# Patient Record
Sex: Male | Born: 1990
Health system: Southern US, Community
[De-identification: ages and names within clinical notes are randomized; demographics above are authoritative.]

## PROBLEM LIST (undated history)

## (undated) DIAGNOSIS — F32A Depression, unspecified: Secondary | ICD-10-CM

## (undated) DIAGNOSIS — F419 Anxiety disorder, unspecified: Secondary | ICD-10-CM

## (undated) DIAGNOSIS — F329 Major depressive disorder, single episode, unspecified: Secondary | ICD-10-CM

## (undated) HISTORY — PX: APPENDECTOMY: SHX54

## (undated) HISTORY — DX: Depression, unspecified: F32.A

## (undated) HISTORY — DX: Major depressive disorder, single episode, unspecified: F32.9

---

## 2013-12-26 ENCOUNTER — Encounter (HOSPITAL_COMMUNITY): Payer: Self-pay | Admitting: Emergency Medicine

## 2013-12-26 ENCOUNTER — Emergency Department (HOSPITAL_COMMUNITY)
Admission: EM | Admit: 2013-12-26 | Discharge: 2013-12-27 | Disposition: A | Discharge status: Home / Self Care | Payer: Self-pay | Attending: Emergency Medicine | Admitting: Emergency Medicine

## 2013-12-26 DIAGNOSIS — M25562 Pain in left knee: Secondary | ICD-10-CM

## 2013-12-26 DIAGNOSIS — F17200 Nicotine dependence, unspecified, uncomplicated: Secondary | ICD-10-CM | POA: Insufficient documentation

## 2013-12-26 DIAGNOSIS — M25569 Pain in unspecified knee: Secondary | ICD-10-CM | POA: Insufficient documentation

## 2013-12-26 DIAGNOSIS — R209 Unspecified disturbances of skin sensation: Secondary | ICD-10-CM | POA: Insufficient documentation

## 2013-12-26 DIAGNOSIS — Z8659 Personal history of other mental and behavioral disorders: Secondary | ICD-10-CM | POA: Insufficient documentation

## 2013-12-26 DIAGNOSIS — R202 Paresthesia of skin: Secondary | ICD-10-CM

## 2013-12-26 HISTORY — DX: Anxiety disorder, unspecified: F41.9

## 2013-12-26 NOTE — ED Notes (Signed)
Chronic lt knee pain.  More pain for 8 months and now he has lt 3rd toe numbness on his lt foot for several days

## 2013-12-27 ENCOUNTER — Emergency Department (HOSPITAL_COMMUNITY): Payer: Self-pay

## 2013-12-27 MED ORDER — NAPROXEN 500 MG PO TABS: 500.0000 mg | ORAL_TABLET | Freq: Two times a day (BID) | ORAL | Status: DC

## 2013-12-27 NOTE — ED Notes (Signed)
Patient transported to X-ray 

## 2013-12-27 NOTE — ED Provider Notes (Signed)
CSN: [id number]     Arrival date & time 12/26/13  2203 History   First MD Initiated Contact with Patient 12/27/13 0030     Chief Complaint  Patient presents with  . Knee Pain     (Consider location/radiation/quality/duration/timing/severity/associated sxs/prior Treatment) Patient is a 23 y.o. male presenting with knee pain. The history is provided by the patient. No language interpreter was used.  Knee Pain Pain details:    Quality:  Throbbing and sharp   Radiates to:  Does not radiate   Severity:  Moderate   Duration:  2 days   Timing:  Intermittent   Progression:  Worsening Chronicity:  Recurrent Foreign body present:  No foreign bodies Prior injury to area:  Unable to specify Risk factors: no concern for non-accidental trauma and no known bone disorder     Past Medical History  Diagnosis Date  . Anxiety    History reviewed. No pertinent past surgical history. No family history on file. History  Substance Use Topics  . Smoking status: Current Every Day Smoker  . Smokeless tobacco: Not on file  . Alcohol Use: No    Review of Systems  All other systems reviewed and are negative.     Allergies  Review of patient's allergies indicates no known allergies.  Home Medications  No current outpatient prescriptions on file. BP 140/71  Pulse 75  Temp(Src) 98.3 F (36.8 C) (Oral)  Resp 18  Ht 6\' 1"  (1.854 m)  Wt 223 lb (101.152 kg)  BMI 29.43 kg/m2  SpO2 99% Physical Exam  Nursing note and vitals reviewed. Constitutional: He is oriented to person, place, and time. He appears well-developed and well-nourished.  HENT:  Head: Normocephalic.  Eyes: Pupils are equal, round, and reactive to light.  Neck: Normal range of motion.  Cardiovascular: Normal rate and regular rhythm.   Pulmonary/Chest: Effort normal and breath sounds normal.  Abdominal: Soft. Bowel sounds are normal.  Musculoskeletal: He exhibits tenderness. He exhibits no edema.  Neurological: He is alert  and oriented to person, place, and time.  Skin: Skin is warm and dry.  Psychiatric: He has a normal mood and affect. His behavior is normal. Judgment and thought content normal.   Mild tenderness to the medial and lateral aspect of left knee.  No swelling or deformity.  No laxity noted. Paresthesia of middle left toe, primarily at distal aspect.  Toe warm to touch, brisk capillary refill. ED Course  Procedures (including critical care time) Labs Review Labs Reviewed - No data to display Imaging Review No results found.   EKG Interpretation None     Radiology results reviewed and shared with patient.  No indication of acute injury. Patient with history of chronic knee problems, is awaiting initiation of his health insurance in June 2015 before seeking specialty care.    MDM   Final diagnoses:  None    Left knee pain. Paresthesia of middle left toe.    Jimmye Normanavid John Caven Perine, NP 12/27/13 [phone]

## 2013-12-27 NOTE — Discharge Instructions (Signed)
Paresthesia Paresthesia is an abnormal burning or prickling sensation. This sensation is generally felt in the hands, arms, legs, or feet. However, it may occur in any part of the body. It is usually not painful. The feeling may be described as:  Tingling or numbness.  "Pins and needles."  Skin crawling.  Buzzing.  Limbs "falling asleep."  Itching. Most people experience temporary (transient) paresthesia at some time in their lives. CAUSES  Paresthesia may occur when you breathe too quickly (hyperventilation). It can also occur without any apparent cause. Commonly, paresthesia occurs when pressure is placed on a nerve. The feeling quickly goes away once the pressure is removed. For some people, however, paresthesia is a long-lasting (chronic) condition caused by an underlying disorder. The underlying disorder may be:  A traumatic, direct injury to nerves. Examples include a:  Broken (fractured) neck.  Fractured skull.  A disorder affecting the brain and spinal cord (central nervous system). Examples include:  Transverse myelitis.  Encephalitis.  Transient ischemic attack.  Multiple sclerosis.  Stroke.  Tumor or blood vessel problems, such as an arteriovenous malformation pressing against the brain or spinal cord.  A condition that damages the peripheral nerves (peripheral neuropathy). Peripheral nerves are not part of the brain and spinal cord. These conditions include:  Diabetes.  Peripheral vascular disease.  Nerve entrapment syndromes, such as carpal tunnel syndrome.  Shingles.  Hypothyroidism.  Vitamin B12 deficiencies.  Alcoholism.  Heavy metal poisoning (lead, arsenic).  Rheumatoid arthritis.  Systemic lupus erythematosus. DIAGNOSIS  Your caregiver will attempt to find the underlying cause of your paresthesia. Your caregiver may:  Take your medical history.  Perform a physical exam.  Order various lab tests.  Order imaging tests. TREATMENT    Treatment for paresthesia depends on the underlying cause. HOME CARE INSTRUCTIONS  Avoid drinking alcohol.  You may consider massage or acupuncture to help relieve your symptoms.  Keep all follow-up appointments as directed by your caregiver. SEEK IMMEDIATE MEDICAL CARE IF:   You feel weak.  You have trouble walking or moving.  You have problems with speech or vision.  You feel confused.  You cannot control your bladder or bowel movements.  You feel numbness after an injury.  You faint.  Your burning or prickling feeling gets worse when walking.  You have pain, cramps, or dizziness.  You develop a rash. MAKE SURE YOU:  Understand these instructions.  Will watch your condition.  Will get help right away if you are not doing well or get worse. Document Released: 08/23/2002 Document Revised: 11/25/2011 Document Reviewed: 05/24/2011 [facility]ExitCare Patient Information 2014 [place], [place]. Knee Pain Knee pain can be a result of an injury or other medical conditions. Treatment will depend on the cause of your pain. HOME CARE  Only take medicine as told by your doctor.  Keep a healthy weight. Being overweight can make the knee hurt more.  Stretch before exercising or playing sports.  If there is constant knee pain, change the way you exercise. Ask your doctor for advice.  Make sure shoes fit well. Choose the right shoe for the sport or activity.  Protect your knees. Wear kneepads if needed.  Rest when you are tired. GET HELP RIGHT AWAY IF:   Your knee pain does not stop.  Your knee pain does not get better.  Your knee joint feels hot to the touch.  You have a fever. MAKE SURE YOU:   Understand these instructions.  Will watch this condition.  Will get help right  away if you are not doing well or get worse. Document Released: 11/29/2008 Document Revised: 11/25/2011 Document Reviewed: 11/29/2008 [facility] Patient Information 2014 [place]Care, [place].

## 2013-12-27 NOTE — ED Provider Notes (Signed)
Medical screening examination/treatment/procedure(s) were performed by non-physician practitioner and as supervising physician I was immediately available for consultation/collaboration.   EKG Interpretation None        Kecia Swoboda, MD 12/27/13 0626 

## 2014-03-16 ENCOUNTER — Ambulatory Visit (INDEPENDENT_AMBULATORY_CARE_PROVIDER_SITE_OTHER): Payer: BC Managed Care – PPO | Admitting: Emergency Medicine

## 2014-03-16 VITALS — BP 120/76 | HR 72 | Temp 98.0°F | Resp 16 | Ht 71.5 in | Wt 217.0 lb

## 2014-03-16 DIAGNOSIS — M5432 Sciatica, left side: Secondary | ICD-10-CM

## 2014-03-16 DIAGNOSIS — M5430 Sciatica, unspecified side: Secondary | ICD-10-CM

## 2014-03-16 DIAGNOSIS — H6980 Other specified disorders of Eustachian tube, unspecified ear: Secondary | ICD-10-CM

## 2014-03-16 DIAGNOSIS — F17200 Nicotine dependence, unspecified, uncomplicated: Secondary | ICD-10-CM

## 2014-03-16 DIAGNOSIS — H6982 Other specified disorders of Eustachian tube, left ear: Secondary | ICD-10-CM

## 2014-03-16 MED ORDER — VARENICLINE TARTRATE 0.5 MG PO TABS: ORAL_TABLET | ORAL | Status: DC

## 2014-03-16 MED ORDER — TRIAMCINOLONE ACETONIDE 55 MCG/ACT NA AERO: 2.0000 | INHALATION_SPRAY | Freq: Every day | NASAL | Status: DC

## 2014-03-16 MED ORDER — NAPROXEN SODIUM 550 MG PO TABS: 550.0000 mg | ORAL_TABLET | Freq: Two times a day (BID) | ORAL | Status: AC

## 2014-03-16 MED ORDER — VARENICLINE TARTRATE 1 MG PO TABS: 1.0000 mg | ORAL_TABLET | Freq: Two times a day (BID) | ORAL | Status: DC

## 2014-03-16 NOTE — Progress Notes (Signed)
[facility] [address], [place] [address] [phone]  Date:  03/16/2014   Name:  Edwin RosebushJeremy Novak   DOB:  1991/01/21   MRN:  [id number]  PCP:  No PCP Per Patient    Chief Complaint: Otalgia, Foot Pain and Nicotine Dependence   History of Present Illness:  Edwin RosebushJeremy Novak is a 23 y.o. very pleasant male patient who presents with the following:  Has one year history of pain and numbness in left middle (3rd) toe.  Says the pain radiates proximally into calf and back of thigh.  Says worse with prolonged standing.  Some weakness at times. No history of injury or over use. Pain diminished with rest. Has post nasal drainage and night time cough with pain in left ear.  No fever or chills.  No nasal drainage or sputum production. No wheezing or shortness of breath.  No nausea or vomiting.   Requesting chantix  Smokes 1 ppd  X 12 years. No improvement with over the counter medications or other home remedies. Denies other complaint or health concern today.   There are no active problems to display for this patient.   Past Medical History  Diagnosis Date  . Anxiety   . Depression     Past Surgical History  Procedure Laterality Date  . Appendectomy      History  Substance Use Topics  . Smoking status: Current Every Day Smoker  . Smokeless tobacco: Not on file  . Alcohol Use: No    Family History  Problem Relation Age of Onset  . Cancer Mother     ovarian  . Mental illness Mother   . Hyperlipidemia Father   . Hypertension Father     No Known Allergies  Medication list has been reviewed and updated.  Current Outpatient Prescriptions on File Prior to Visit  Medication Sig Dispense Refill  . naproxen (NAPROSYN) 500 MG tablet Take 1 tablet (500 mg total) by mouth 2 (two) times daily.  20 tablet  0   No current facility-administered medications on file prior to visit.    Review of Systems:  As per HPI, otherwise negative.    Physical  Examination: Filed Vitals:   03/16/14 1036  BP: 120/76  Pulse: 72  Temp: 98 F (36.7 C)  Resp: 16   Filed Vitals:   03/16/14 1036  Height: 5' 11.5" (1.816 m)  Weight: 217 lb (98.431 kg)   Body mass index is 29.85 kg/(m^2). Ideal Body Weight: Weight in (lb) to have BMI = 25: 181.4  GEN: WDWN, NAD, Non-toxic, A & O x 3 HEENT: Atraumatic, Normocephalic. Neck supple. No masses, No LAD. Ears and Nose: No external deformity.  Eustachian tube dysfunction on left CV: RRR, No M/G/R. No JVD. No thrill. No extra heart sounds. PULM: CTA B, no wheezes, crackles, rhonchi. No retractions. No resp. distress. No accessory muscle use. ABD: S, NT, ND, +BS. No rebound. No HSM. EXTR: No c/c/e NEURO Normal gait. Motor intact. PSYCH: Normally interactive. Conversant. Not depressed or anxious appearing.  Calm demeanor.    Assessment and Plan: Sciatic neuritis MRI Anaprox Eustachian tube dysfunction left nasacort Tobacco abuse chantix  Signed,  Phillips OdorJeffery Anderson, MD

## 2014-03-16 NOTE — Patient Instructions (Signed)
Smoking Cessation, Tips for Success If you are ready to quit smoking, congratulations! You have chosen to help yourself be healthier. Cigarettes bring nicotine, tar, carbon monoxide, and other irritants into your body. Your lungs, heart, and blood vessels will be able to work better without these poisons. There are many different ways to quit smoking. Nicotine gum, nicotine patches, a nicotine inhaler, or nicotine nasal spray can help with physical craving. Hypnosis, support groups, and medicines help break the habit of smoking. WHAT THINGS CAN I DO TO MAKE QUITTING EASIER?  Here are some tips to help you quit for good: Pick a date when you will quit smoking completely. Tell all of your friends and family about your plan to quit on that date. Do not try to slowly cut down on the number of cigarettes you are smoking. Pick a quit date and quit smoking completely starting on that day. Throw away all cigarettes.  Clean and remove all ashtrays from your home, work, and car.  On a card, write down your reasons for quitting. Carry the card with you and read it when you get the urge to smoke.  Cleanse your body of nicotine. Drink enough water and fluids to keep your urine clear or pale yellow. Do this after quitting to flush the nicotine from your body.  Learn to predict your moods. Do not let a bad situation be your excuse to have a cigarette. Some situations in your life might tempt you into wanting a cigarette.  Never have "just one" cigarette. It leads to wanting another and another. Remind yourself of your decision to quit.  Change habits associated with smoking. If you smoked while driving or when feeling stressed, try other activities to replace smoking. Stand up when drinking your coffee. Brush your teeth after eating. Sit in a different chair when you read the paper. Avoid alcohol while trying to quit, and try to drink fewer caffeinated beverages. Alcohol and caffeine may urge you to smoke.   Avoid foods and drinks that can trigger a desire to smoke, such as sugary or spicy foods and alcohol.  Ask people who smoke not to smoke around you.  Have something planned to do right after eating or having a cup of coffee. For example, plan to take a walk or exercise.  Try a relaxation exercise to calm you down and decrease your stress. Remember, you may be tense and nervous for the first 2 weeks after you quit, but this will pass.  Find new activities to keep your hands busy. Play with a pen, coin, or rubber band. Doodle or draw things on paper.  Brush your teeth right after eating. This will help cut down on the craving for the taste of tobacco after meals. You can also try mouthwash.  Use oral substitutes in place of cigarettes. Try using lemon drops, carrots, cinnamon sticks, or chewing gum. Keep them handy so they are available when you have the urge to smoke.  When you have the urge to smoke, try deep breathing.  Designate your home as a nonsmoking area.  If you are a heavy smoker, ask your health care provider about a prescription for nicotine chewing gum. It can ease your withdrawal from nicotine.  Reward yourself. Set aside the cigarette money you save and buy yourself something nice.  Look for support from others. Join a support group or smoking cessation program. Ask someone at home or at work to help you with your plan to quit smoking.  Always   ask yourself, "Do I need this cigarette or is this just a reflex?" Tell yourself, "Today, I choose not to smoke," or "I do not want to smoke." You are reminding yourself of your decision to quit. Do not replace cigarette smoking with electronic cigarettes (commonly called e-cigarettes). The safety of e-cigarettes is unknown, and some may contain harmful chemicals. If you relapse, do not give up! Plan ahead and think about what you will do the next time you get the urge to smoke.  HOW WILL I FEEL WHEN I QUIT SMOKING? You may have  symptoms of withdrawal because your body is used to nicotine (the addictive substance in cigarettes). You may crave cigarettes, be irritable, feel very hungry, cough often, get headaches, or have difficulty concentrating. The withdrawal symptoms are only temporary. They are strongest when you first quit but will go away within 10-14 days. When withdrawal symptoms occur, stay in control. Think about your reasons for quitting. Remind yourself that these are signs that your body is healing and getting used to being without cigarettes. Remember that withdrawal symptoms are easier to treat than the major diseases that smoking can cause.  Even after the withdrawal is over, expect periodic urges to smoke. However, these cravings are generally short lived and will go away whether you smoke or not. Do not smoke!  WHAT RESOURCES ARE AVAILABLE TO HELP ME QUIT SMOKING? Your health care provider can direct you to community resources or hospitals for support, which may include: Group support. Education. Hypnosis. Therapy. Document Released: 05/31/2004 Document Revised: 06/23/2013 Document Reviewed: 02/18/2013 ExitCare Patient Information 2015 [employer]. This information is not intended to replace advice given to you by your health care provider. Make sure you discuss any questions you have with your health care provider. Smoking Cessation Quitting smoking is important to your health and has many advantages. However, it is not always easy to quit since nicotine is a very addictive drug. Often times, people try 3 times or more before being able to quit. This document explains the best ways for you to prepare to quit smoking. Quitting takes hard work and a lot of effort, but you can do it. ADVANTAGES OF QUITTING SMOKING  You will live longer, feel better, and live better.  Your body will feel the impact of quitting smoking almost immediately.  Within 20 minutes, blood pressure decreases. Your pulse returns to  its normal level.  After 8 hours, carbon monoxide levels in the blood return to normal. Your oxygen level increases.  After 24 hours, the chance of having a heart attack starts to decrease. Your breath, hair, and body stop smelling like smoke.  After 48 hours, damaged nerve endings begin to recover. Your sense of taste and smell improve.  After 72 hours, the body is virtually free of nicotine. Your bronchial tubes relax and breathing becomes easier.  After 2 to 12 weeks, lungs can hold more air. Exercise becomes easier and circulation improves.  The risk of having a heart attack, stroke, cancer, or lung disease is greatly reduced.  After 1 year, the risk of coronary heart disease is cut in half.  After 5 years, the risk of stroke falls to the same as a nonsmoker.  After 10 years, the risk of lung cancer is cut in half and the risk of other cancers decreases significantly.  After 15 years, the risk of coronary heart disease drops, usually to the level of a nonsmoker.  If you are pregnant, quitting smoking will improve your   chances of having a healthy baby.  The people you live with, especially any children, will be healthier.  You will have extra money to spend on things other than cigarettes. QUESTIONS TO THINK ABOUT BEFORE ATTEMPTING TO QUIT You may want to talk about your answers with your caregiver.  Why do you want to quit?  If you tried to quit in the past, what helped and what did not?  What will be the most difficult situations for you after you quit? How will you plan to handle them?  Who can help you through the tough times? Your family? Friends? A caregiver?  What pleasures do you get from smoking? What ways can you still get pleasure if you quit? Here are some questions to ask your caregiver:  How can you help me to be successful at quitting?  What medicine do you think would be best for me and how should I take it?  What should I do if I need more  help?  What is smoking withdrawal like? How can I get information on withdrawal? GET READY  Set a quit date.  Change your environment by getting rid of all cigarettes, ashtrays, matches, and lighters in your home, car, or work. Do not let people smoke in your home.  Review your past attempts to quit. Think about what worked and what did not. GET SUPPORT AND ENCOURAGEMENT You have a better chance of being successful if you have help. You can get support in many ways.  Tell your family, friends, and co-workers that you are going to quit and need their support. Ask them not to smoke around you.  Get individual, group, or telephone counseling and support. Programs are available at local hospitals and health centers. Call your local health department for information about programs in your area.  Spiritual beliefs and practices may help some smokers quit.  Download a "quit meter" on your computer to keep track of quit statistics, such as how long you have gone without smoking, cigarettes not smoked, and money saved.  Get a self-help book about quitting smoking and staying off of tobacco. LEARN NEW SKILLS AND BEHAVIORS  Distract yourself from urges to smoke. Talk to someone, go for a walk, or occupy your time with a task.  Change your normal routine. Take a different route to work. Drink tea instead of coffee. Eat breakfast in a different place.  Reduce your stress. Take a hot bath, exercise, or read a book.  Plan something enjoyable to do every day. Reward yourself for not smoking.  Explore interactive web-based programs that specialize in helping you quit. GET MEDICINE AND USE IT CORRECTLY Medicines can help you stop smoking and decrease the urge to smoke. Combining medicine with the above behavioral methods and support can greatly increase your chances of successfully quitting smoking.  Nicotine replacement therapy helps deliver nicotine to your body without the negative effects and  risks of smoking. Nicotine replacement therapy includes nicotine gum, lozenges, inhalers, nasal sprays, and skin patches. Some may be available over-the-counter and others require a prescription.  Antidepressant medicine helps people abstain from smoking, but how this works is unknown. This medicine is available by prescription.  Nicotinic receptor partial agonist medicine simulates the effect of nicotine in your brain. This medicine is available by prescription. Ask your caregiver for advice about which medicines to use and how to use them based on your health history. Your caregiver will tell you what side effects to look out for if you   choose to be on a medicine or therapy. Carefully read the information on the package. Do not use any other product containing nicotine while using a nicotine replacement product.  RELAPSE OR DIFFICULT SITUATIONS Most relapses occur within the first 3 months after quitting. Do not be discouraged if you start smoking again. Remember, most people try several times before finally quitting. You may have symptoms of withdrawal because your body is used to nicotine. You may crave cigarettes, be irritable, feel very hungry, cough often, get headaches, or have difficulty concentrating. The withdrawal symptoms are only temporary. They are strongest when you first quit, but they will go away within 10-14 days. To reduce the chances of relapse, try to:  Avoid drinking alcohol. Drinking lowers your chances of successfully quitting.  Reduce the amount of caffeine you consume. Once you quit smoking, the amount of caffeine in your body increases and can give you symptoms, such as a rapid heartbeat, sweating, and anxiety.  Avoid smokers because they can make you want to smoke.  Do not let weight gain distract you. Many smokers will gain weight when they quit, usually less than 10 pounds. Eat a healthy diet and stay active. You can always lose the weight gained after you  quit.  Find ways to improve your mood other than smoking. FOR MORE INFORMATION  www.smokefree.gov  Document Released: 08/27/2001 Document Revised: 03/03/2012 Document Reviewed: 12/12/2011 ExitCare Patient Information 2015 [employer]. This information is not intended to replace advice given to you by your health care provider. Make sure you discuss any questions you have with your health care provider.  

## 2014-03-25 ENCOUNTER — Inpatient Hospital Stay: Admission: RE | Admit: 2014-03-25 | Payer: Self-pay | Source: Ambulatory Visit

## 2014-03-27 ENCOUNTER — Ambulatory Visit
Admission: RE | Admit: 2014-03-27 | Discharge: 2014-03-27 | Disposition: A | Discharge status: Home / Self Care | Payer: BC Managed Care – PPO | Source: Ambulatory Visit | Attending: Emergency Medicine | Admitting: Emergency Medicine

## 2014-03-27 DIAGNOSIS — M5432 Sciatica, left side: Secondary | ICD-10-CM

## 2014-05-05 ENCOUNTER — Ambulatory Visit (INDEPENDENT_AMBULATORY_CARE_PROVIDER_SITE_OTHER): Payer: 59 | Admitting: Family Medicine

## 2014-05-05 ENCOUNTER — Ambulatory Visit (INDEPENDENT_AMBULATORY_CARE_PROVIDER_SITE_OTHER): Payer: 59

## 2014-05-05 VITALS — BP 142/80 | HR 82 | Temp 98.0°F | Resp 16 | Ht 71.5 in | Wt 234.2 lb

## 2014-05-05 DIAGNOSIS — R202 Paresthesia of skin: Secondary | ICD-10-CM

## 2014-05-05 DIAGNOSIS — R209 Unspecified disturbances of skin sensation: Secondary | ICD-10-CM

## 2014-05-05 DIAGNOSIS — R1032 Left lower quadrant pain: Secondary | ICD-10-CM

## 2014-05-05 DIAGNOSIS — M79609 Pain in unspecified limb: Secondary | ICD-10-CM

## 2014-05-05 DIAGNOSIS — S0300XA Dislocation of jaw, unspecified side, initial encounter: Secondary | ICD-10-CM

## 2014-05-05 DIAGNOSIS — M79675 Pain in left toe(s): Secondary | ICD-10-CM

## 2014-05-05 DIAGNOSIS — R197 Diarrhea, unspecified: Secondary | ICD-10-CM

## 2014-05-05 DIAGNOSIS — F411 Generalized anxiety disorder: Secondary | ICD-10-CM

## 2014-05-05 LAB — POCT URINALYSIS DIPSTICK
Bilirubin, UA: NEGATIVE
Blood, UA: NEGATIVE
GLUCOSE UA: NEGATIVE
KETONES UA: NEGATIVE
Leukocytes, UA: NEGATIVE
Nitrite, UA: NEGATIVE
Protein, UA: NEGATIVE
Spec Grav, UA: 1.015
Urobilinogen, UA: 0.2
pH, UA: 5.5

## 2014-05-05 LAB — POCT UA - MICROSCOPIC ONLY
Bacteria, U Microscopic: NEGATIVE
Casts, Ur, LPF, POC: NEGATIVE
Crystals, Ur, HPF, POC: NEGATIVE
Epithelial cells, urine per micros: NEGATIVE
Mucus, UA: NEGATIVE
RBC, URINE, MICROSCOPIC: NEGATIVE
WBC, UR, HPF, POC: NEGATIVE
Yeast, UA: NEGATIVE

## 2014-05-05 LAB — POCT CBC
GRANULOCYTE PERCENT: 51.4 % (ref 37–80)
HCT, POC: 46.8 % (ref 43.5–53.7)
Hemoglobin: 15.4 g/dL (ref 14.1–18.1)
Lymph, poc: 2.8 (ref 0.6–3.4)
MCH, POC: 28.6 pg (ref 27–31.2)
MCHC: 32.9 g/dL (ref 31.8–35.4)
MCV: 87.1 fL (ref 80–97)
MID (cbc): 0.4 (ref 0–0.9)
MPV: 7 fL (ref 0–99.8)
POC Granulocyte: 3.3 (ref 2–6.9)
POC LYMPH PERCENT: 42.6 %L (ref 10–50)
POC MID %: 6 % (ref 0–12)
Platelet Count, POC: 225 10*3/uL (ref 142–424)
RBC: 5.37 M/uL (ref 4.69–6.13)
RDW, POC: 13.7 %
WBC: 6.5 10*3/uL (ref 4.6–10.2)

## 2014-05-05 LAB — POCT SEDIMENTATION RATE: POCT SED RATE: 5 mm/h (ref 0–22)

## 2014-05-05 MED ORDER — LORAZEPAM 0.5 MG PO TABS: 0.5000 mg | ORAL_TABLET | Freq: Every day | ORAL | Status: DC | PRN

## 2014-05-05 MED ORDER — CYCLOBENZAPRINE HCL 5 MG PO TABS: 5.0000 mg | ORAL_TABLET | Freq: Every day | ORAL | Status: DC

## 2014-05-05 MED ORDER — SERTRALINE HCL 50 MG PO TABS: 50.0000 mg | ORAL_TABLET | Freq: Every day | ORAL | Status: DC
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
## 2014-05-05 NOTE — Patient Instructions (Signed)
1. Take Miralax daily. 2.  Take Zoloft/Sertraline at night. 3.  Take Lorazepam daily as needed. 4.  Take Flexeril/Cyclobenzaprine every night for clenching jaw.

## 2014-05-06 DIAGNOSIS — F411 Generalized anxiety disorder: Secondary | ICD-10-CM | POA: Insufficient documentation

## 2014-05-06 LAB — COMPREHENSIVE METABOLIC PANEL
ALT: 41 U/L (ref 0–53)
AST: 24 U/L (ref 0–37)
Albumin: 5.1 g/dL (ref 3.5–5.2)
Alkaline Phosphatase: 47 U/L (ref 39–117)
BUN: 12 mg/dL (ref 6–23)
CHLORIDE: 101 meq/L (ref 96–112)
CO2: 28 meq/L (ref 19–32)
Calcium: 10 mg/dL (ref 8.4–10.5)
Creat: 0.94 mg/dL (ref 0.50–1.35)
Glucose, Bld: 99 mg/dL (ref 70–99)
Potassium: 4.4 mEq/L (ref 3.5–5.3)
Sodium: 138 mEq/L (ref 135–145)
TOTAL PROTEIN: 7.6 g/dL (ref 6.0–8.3)
Total Bilirubin: 0.5 mg/dL (ref 0.2–1.2)

## 2014-05-06 LAB — TSH: TSH: 2.881 u[IU]/mL (ref 0.350–4.500)

## 2014-05-06 NOTE — Progress Notes (Signed)
Unable to leave a message at this time due to mailbox being full.  Will try again later.

## 2014-05-09 NOTE — Progress Notes (Signed)
Left a message for patient to return call to schedule ov with Dr. Katrinka Blazing.

## 2014-05-10 NOTE — Progress Notes (Signed)
Scheduled appointment for 10/26 at 4:15 with Dr. Katrinka Blazing

## 2014-05-16 ENCOUNTER — Ambulatory Visit: Payer: 59 | Admitting: Podiatry

## 2014-05-31 ENCOUNTER — Telehealth: Payer: Self-pay

## 2014-05-31 NOTE — Telephone Encounter (Signed)
Patient called. States he thinks he is having symptoms of a stomach ulcer. States he has been in a lot of pain for the last 2 days and he has also seen blood in his stools. Please return call and advise. CB # [phone]

## 2014-06-01 NOTE — Telephone Encounter (Signed)
LM for pt to RTC-  

## 2014-06-14 ENCOUNTER — Telehealth: Payer: Self-pay | Admitting: *Deleted

## 2014-06-14 NOTE — Telephone Encounter (Signed)
Pt states he had the abd pain and bloody stools for 2 days and it resolved the day we called him to come in. He decided not to come in. Pt reports he will be following up with Dr. Katrinka BlazingSmith on October 26.

## 2014-06-14 NOTE — Telephone Encounter (Signed)
Message copied by Blima LedgerICHARDSON, Daronte Shostak D on Tue Jun 14, 2014  2:28 PM ------      Message from: Ethelda ChickSMITH, KRISTI M      Created: Tue Jun 14, 2014 12:46 PM       Please return pt call --- he called on 05/31/14 reporting abdominal pain and bloody stools.  Recommend follow-up. ------

## 2014-06-16 NOTE — Telephone Encounter (Signed)
Noted  

## 2014-07-11 ENCOUNTER — Encounter: Payer: Self-pay | Admitting: Family Medicine

## 2014-07-14 NOTE — Progress Notes (Signed)
This encounter was created in error - please disregard.

## 2014-09-04 ENCOUNTER — Encounter (HOSPITAL_COMMUNITY): Payer: Self-pay

## 2014-09-04 ENCOUNTER — Emergency Department (HOSPITAL_COMMUNITY)
Admission: EM | Admit: 2014-09-04 | Discharge: 2014-09-04 | Disposition: A | Discharge status: Home / Self Care | Payer: 59 | Source: Home / Self Care | Attending: Emergency Medicine | Admitting: Emergency Medicine

## 2014-09-04 DIAGNOSIS — K130 Diseases of lips: Secondary | ICD-10-CM

## 2014-09-04 MED ORDER — IBUPROFEN 800 MG PO TABS: 800.0000 mg | ORAL_TABLET | Freq: Three times a day (TID) | ORAL | Status: DC

## 2014-09-04 MED ORDER — CLINDAMYCIN HCL 300 MG PO CAPS: 300.0000 mg | ORAL_CAPSULE | Freq: Four times a day (QID) | ORAL | Status: DC

## 2014-09-04 NOTE — Discharge Instructions (Signed)
Cellulitis °Cellulitis is an infection of the skin and the tissue beneath it. The infected area is usually red and tender. Cellulitis occurs most often in the arms and lower legs.  °CAUSES  °Cellulitis is caused by bacteria that enter the skin through cracks or cuts in the skin. The most common types of bacteria that cause cellulitis are staphylococci and streptococci. °SIGNS AND SYMPTOMS  °· Redness and warmth. °· Swelling. °· Tenderness or pain. °· Fever. °DIAGNOSIS  °Your health care provider can usually determine what is wrong based on a physical exam. Blood tests may also be done. °TREATMENT  °Treatment usually involves taking an antibiotic medicine. °HOME CARE INSTRUCTIONS  °· Take your antibiotic medicine as directed by your health care provider. Finish the antibiotic even if you start to feel better. °· Keep the infected arm or leg elevated to reduce swelling. °· Apply a warm cloth to the affected area up to 4 times per day to relieve pain. °· Take medicines only as directed by your health care provider. °· Keep all follow-up visits as directed by your health care provider. °SEEK MEDICAL CARE IF:  °· You notice red streaks coming from the infected area. °· Your red area gets larger or turns dark in color. °· Your bone or joint underneath the infected area becomes painful after the skin has healed. °· Your infection returns in the same area or another area. °· You notice a swollen bump in the infected area. °· You develop new symptoms. °· You have a fever. °SEEK IMMEDIATE MEDICAL CARE IF:  °· You feel very sleepy. °· You develop vomiting or diarrhea. °· You have a general ill feeling (malaise) with muscle aches and pains. °MAKE SURE YOU:  °· Understand these instructions. °· Will watch your condition. °· Will get help right away if you are not doing well or get worse. °Document Released: 06/12/2005 Document Revised: 01/17/2014 Document Reviewed: 11/18/2011 °[employer]® Patient Information ©2015 [employer].  This information is not intended to replace advice given to you by your health care provider. Make sure you discuss any questions you have with your health care provider. ° °Facial Infection °You have an infection of your face. This requires special attention to help prevent serious problems. Infections in facial wounds can cause poor healing and scars. They can also spread to deeper tissues, especially around the eye. Wound and dental infections can lead to sinusitis, infection of the eye socket, and even meningitis. Permanent damage to the skin, eye, and nervous system may result if facial infections are not treated properly. With severe infections, hospital care for IV antibiotic injections may be needed if they don't respond to oral antibiotics. °Antibiotics must be taken for the full course to insure the infection is eliminated. If the infection came from a bad tooth, it may have to be extracted when the infection is under control. Warm compresses may be applied to reduce skin irritation and remove drainage. °You might need a tetanus shot now if: °· You cannot remember when your last tetanus shot was. °· You have never had a tetanus shot. °· The object that caused your wound was dirty. °If you need a tetanus shot, and you decide not to get one, there is a rare chance of getting tetanus. Sickness from tetanus can be serious. If you got a tetanus shot, your arm may swell, get red and warm to the touch at the shot site. This is common and not a problem. °SEEK IMMEDIATE MEDICAL CARE IF:  °·   You have increased swelling, redness, or trouble breathing. °· You have a severe headache, dizziness, nausea, or vomiting. °· You develop problems with your eyesight. °· You have a fever. °Document Released: 10/10/2004 Document Revised: 11/25/2011 Document Reviewed: 09/02/2005 °ExitCare® Patient Information ©2015 [employer]. This information is not intended to replace advice given to you by your health care provider. Make  sure you discuss any questions you have with your health care provider. ° °

## 2014-09-04 NOTE — ED Notes (Signed)
C/o abdominal pain,nausea, vomiting. Lip swelling. Pain in mouth "6", abdominal pain "10"

## 2014-09-04 NOTE — ED Provider Notes (Signed)
CSN: [id number]     Arrival date & time 09/04/14  1835 History   First MD Initiated Contact with Patient 09/04/14 1849     Chief Complaint  Patient presents with  . Nausea   (Consider location/radiation/quality/duration/timing/severity/associated sxs/prior Treatment) HPI           23 year old male presents complaining of swelling on his right upper lip. He noticed this when he woke up from a nap approximately one hour prior to arrival. The area is swollen and there is a ventral ulceration that is bleeding. He questions whether he may have been bitten by something such as a spider. He also has a feeling of vertigo. He had one episode of diarrhea after he woke up and he has some abdominal pain although he states this is very normal for him, he gets diarrhea and abdominal pain at least once per week. He denies any history of anaphylaxis or severe allergic reaction, or any history of even mild allergic reactions. He has not taken any medications today. He is not eating any foods today. He denies any potential exposure to allergens. His symptoms have not changed much in the hour and a half since he woke up.    Past Medical History  Diagnosis Date  . Anxiety   . Depression    Past Surgical History  Procedure Laterality Date  . Appendectomy     Family History  Problem Relation Age of Onset  . Cancer Mother     ovarian  . Mental illness Mother     anxiety  . Hyperlipidemia Father   . Hypertension Father   . Cancer Father [id number]    gallbladder cancer  . Mental illness Brother     anxiety   History  Substance Use Topics  . Smoking status: Former Smoker    Quit date: 04/07/2014  . Smokeless tobacco: Not on file  . Alcohol Use: No    Review of Systems  HENT:       Lip swelling  Gastrointestinal: Positive for abdominal pain and diarrhea.  Neurological: Positive for dizziness.  All other systems reviewed and are negative.   Allergies  Review of patient's allergies indicates no known  allergies.  Home Medications   Prior to Admission medications   Medication Sig Start Date End Date Taking? Authorizing Provider  clindamycin (CLEOCIN) 300 MG capsule Take 1 capsule (300 mg total) by mouth 4 (four) times daily. 09/04/14   Graylon GoodZachary H Giliana Vantil, PA-C  cyclobenzaprine (FLEXERIL) 5 MG tablet Take 1 tablet (5 mg total) by mouth at bedtime. 05/05/14   Ethelda ChickKristi M Smith, MD  ibuprofen (ADVIL,MOTRIN) 800 MG tablet Take 1 tablet (800 mg total) by mouth 3 (three) times daily. 09/04/14   Adrian BlackwaterZachary H Treyten Monestime, PA-C  LORazepam (ATIVAN) 0.5 MG tablet Take 1 tablet (0.5 mg total) by mouth daily as needed for anxiety. 05/05/14   Ethelda ChickKristi M Smith, MD  naproxen (NAPROSYN) 500 MG tablet Take 1 tablet (500 mg total) by mouth 2 (two) times daily. 12/27/13   Jimmye Normanavid John Smith, NP  naproxen sodium (ANAPROX DS) 550 MG tablet Take 1 tablet (550 mg total) by mouth 2 (two) times daily with a meal. 03/16/14 03/16/15  Carmelina DaneJeffery S Anderson, MD  sertraline (ZOLOFT) 50 MG tablet Take 1 tablet (50 mg total) by mouth daily. 05/05/14   Ethelda ChickKristi M Smith, MD  triamcinolone (NASACORT AQ) 55 MCG/ACT AERO nasal inhaler Place 2 sprays into the nose daily. 03/16/14   Carmelina DaneJeffery S Anderson, MD  varenicline (CHANTIX CONTINUING  MONTH PAK) 1 MG tablet Take 1 tablet (1 mg total) by mouth 2 (two) times daily. 03/16/14   Carmelina DaneJeffery S Anderson, MD  varenicline (CHANTIX) 0.5 MG tablet Starting month pack as directed. Disp 1 pack despite what is printed 03/16/14   Carmelina DaneJeffery S Anderson, MD   BP 152/81 mmHg  Pulse 70  Temp(Src) 97.8 F (36.6 C) (Oral)  Resp 16  SpO2 98% Physical Exam  Constitutional: He is oriented to person, place, and time. He appears well-developed and well-nourished. No distress.  HENT:  Head: Normocephalic and atraumatic.  Right Ear: External ear normal.  Left Ear: External ear normal.  Nose: Nose normal.  Mouth/Throat: Uvula is midline, oropharynx is clear and moist and mucous membranes are normal. No oropharyngeal exudate, posterior  oropharyngeal edema, posterior oropharyngeal erythema or tonsillar abscesses.  The right upper lip is swollen, indurated, tender, with central draining ulceration  Eyes: Conjunctivae are normal. Right eye exhibits no discharge. Left eye exhibits no discharge.  Neck: Normal range of motion. Neck supple. No JVD present. No thyromegaly present.  Cardiovascular: Normal rate, regular rhythm, normal heart sounds and intact distal pulses.   Pulmonary/Chest: Effort normal and breath sounds normal. No respiratory distress. He has no wheezes. He has no rales.  Abdominal: Soft. Bowel sounds are normal. He exhibits no shifting dullness, no distension, no pulsatile midline mass and no mass. There is no hepatosplenomegaly. There is no tenderness. There is no rigidity, no rebound, no guarding, no CVA tenderness, no tenderness at McBurney's point and negative Murphy's sign.  Neurological: He is alert and oriented to person, place, and time. Coordination normal.  Skin: Skin is warm and dry. No rash noted. He is not diaphoretic.  Psychiatric: He has a normal mood and affect. Judgment normal.  Nursing note and vitals reviewed.   ED Course  Procedures (including critical care time) Labs Review Labs Reviewed - No data to display  Imaging Review No results found.   MDM   1. Cellulitis, lip    He has what appears to be an infection of the right upper lip. He complains of abdominal pain and diarrhea, but this is a chronic issue for him and is unchanged so I do not feel that this represents evidence of anaphylaxis in this case. He also feels a mild sensation of vertigo. His vitals are completely normal, he is not hypotensive. He has no urticaria. Also his lip swelling is focal and tender, and appears to be associated with an infection. This patient's symptoms are not consistent with anaphylaxis in this situation. However, I discussed this with him, if he gets any worse she will go to the emergency department. We  will treat him for cellulitis of the upper lip with very strict return precautions.   Meds ordered this encounter  Medications  . clindamycin (CLEOCIN) 300 MG capsule    Sig: Take 1 capsule (300 mg total) by mouth 4 (four) times daily.    Dispense:  28 capsule    Refill:  0  . ibuprofen (ADVIL,MOTRIN) 800 MG tablet    Sig: Take 1 tablet (800 mg total) by mouth 3 (three) times daily.    Dispense:  30 tablet    Refill:  0       Graylon GoodZachary H Birgitta Uhlir, PA-C 09/04/14 1940

## 2014-12-09 ENCOUNTER — Other Ambulatory Visit: Payer: Self-pay | Admitting: Family Medicine

## 2015-07-14 IMAGING — CR DG ABDOMEN ACUTE W/ 1V CHEST
3 series · 3 of 3 positions shown · non-contrast
Comparison: 06/17/2011 and 02/07/2010

CLINICAL DATA: Left lower quadrant abdominal pain.

EXAM:
ACUTE ABDOMEN SERIES (ABDOMEN 2 VIEW & CHEST 1 VIEW)

[PA]
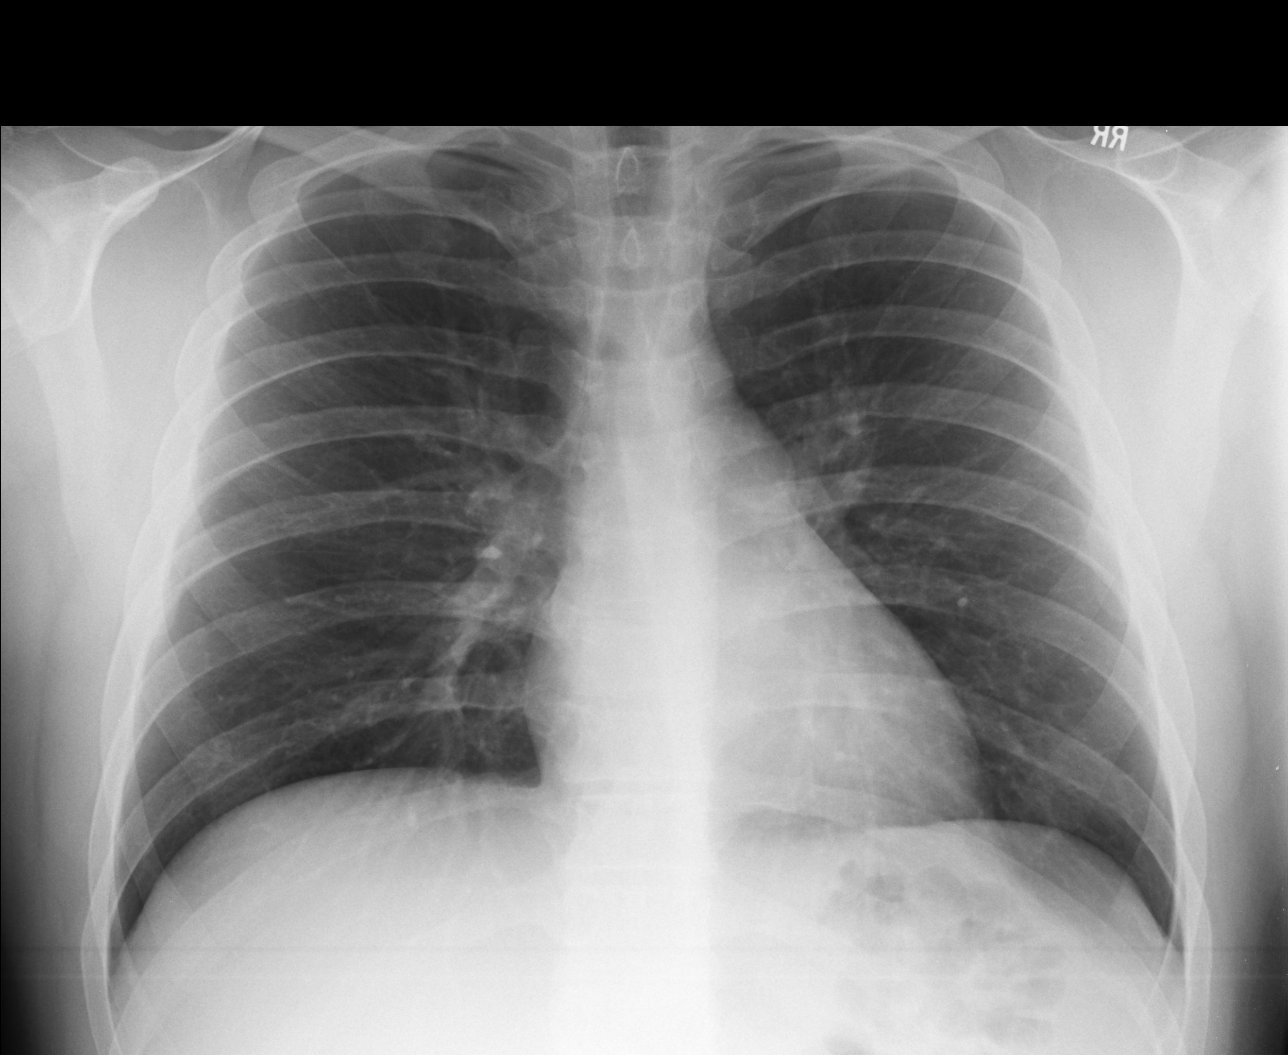

[AP (1 of 2)]
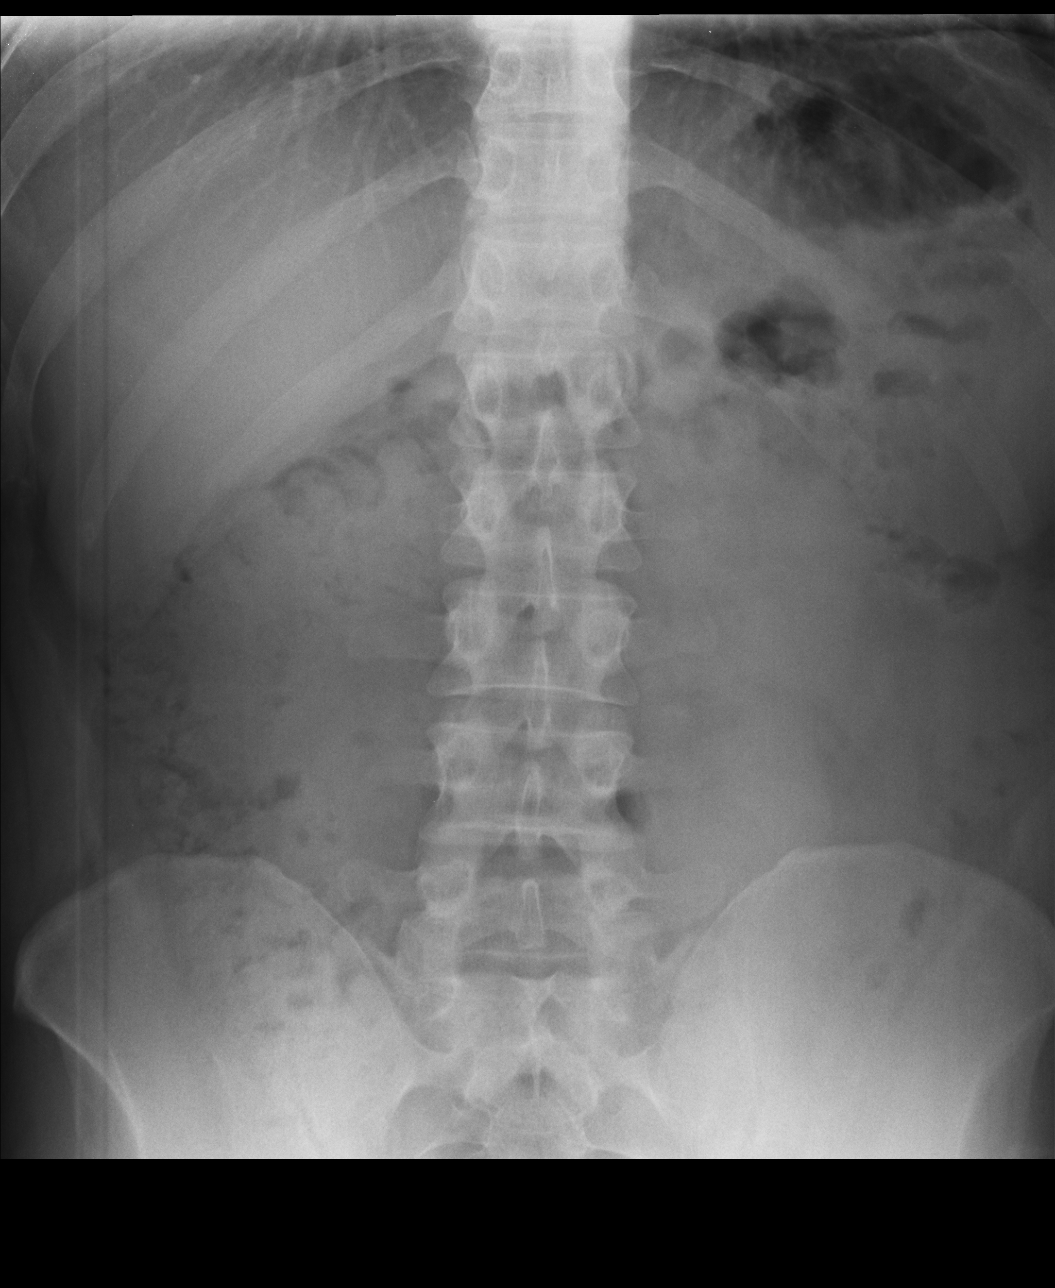

[AP (2 of 2)]
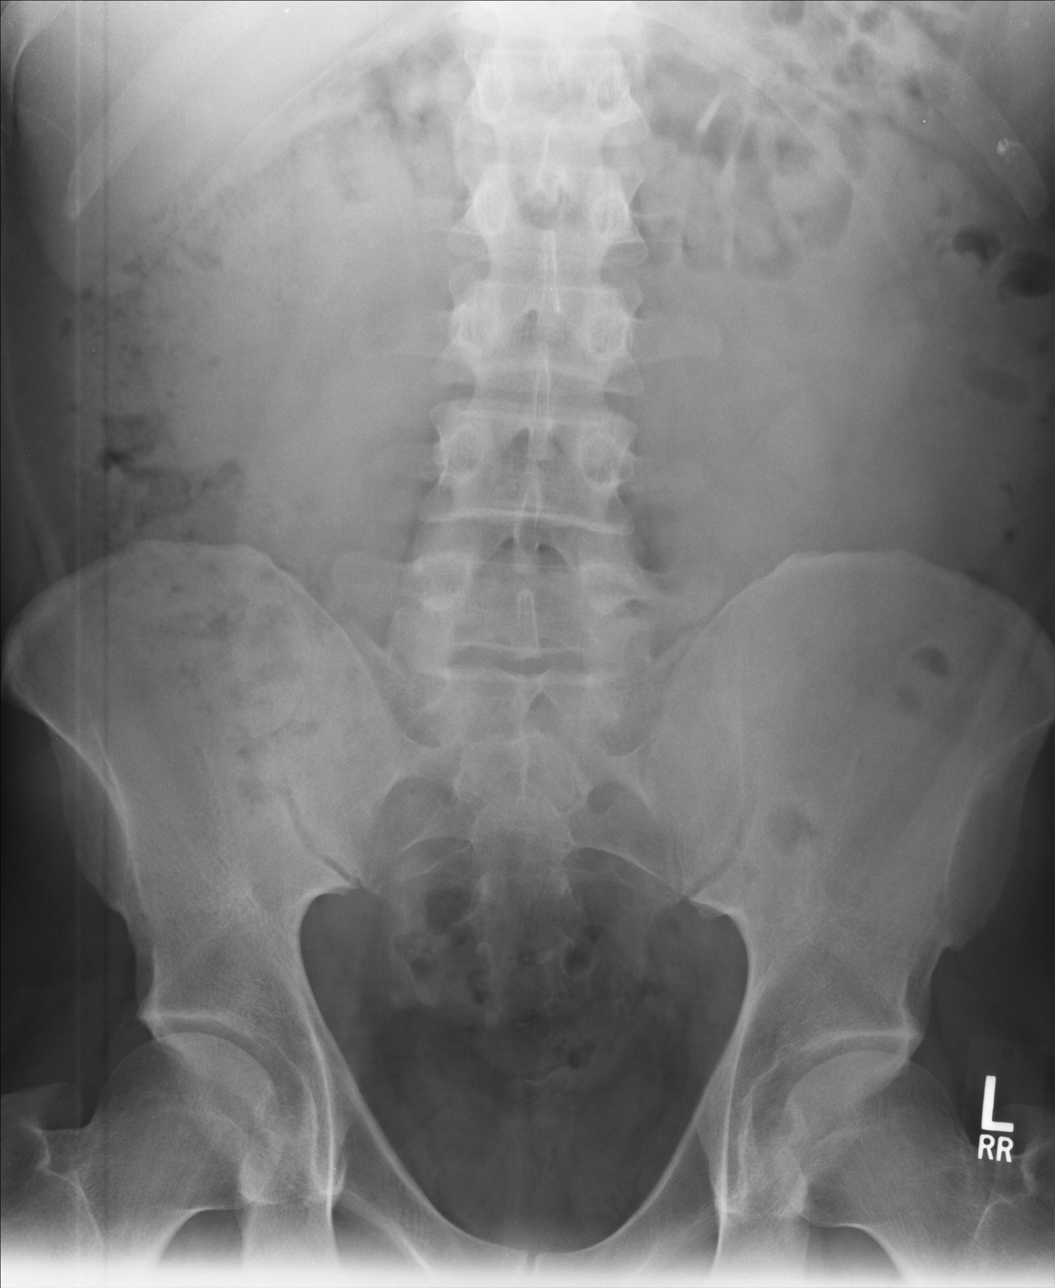

[3 of 3 positions shown; findings below may reference images not displayed]

FINDINGS: Lungs are clear bilaterally. Normal appearance of the heart and
mediastinum. The trachea is midline. No evidence for free air. No
large abdominal calcifications. Moderate stool burden in the
transverse colon and right colon. Nonobstructive bowel gas pattern.
IMPRESSION: No acute chest abnormality.

Moderate stool burden.  Nonspecific bowel gas pattern.

## 2015-09-12 ENCOUNTER — Encounter (HOSPITAL_COMMUNITY): Payer: Self-pay | Admitting: Family Medicine

## 2015-09-12 ENCOUNTER — Emergency Department (HOSPITAL_COMMUNITY)
Admission: EM | Admit: 2015-09-12 | Discharge: 2015-09-12 | Discharge status: Against Medical Advice | Payer: 59 | Attending: Emergency Medicine | Admitting: Emergency Medicine

## 2015-09-12 DIAGNOSIS — M545 Low back pain: Secondary | ICD-10-CM | POA: Insufficient documentation

## 2015-09-12 NOTE — ED Notes (Signed)
Patient reports he is having lower back that radiates up his spine to his upper extremities. Also, reports pain is radiates to his knees. No loss of bowel or bladder. Left arm tingling and lower back. Pt reports "it is mostly just tightness". Pt went to work this afternoon but was unable to stay for shift. Denies any recently injury.

## 2015-09-12 NOTE — ED Notes (Signed)
Consulted with Dr. Corlis LeakMackuen about patients condition. No new orders or emergent room assignment. Pt requested heating packs. Provided patient two heating packs.

## 2015-09-12 NOTE — ED Notes (Signed)
Informed by registration patient left. 

## 2016-05-09 ENCOUNTER — Other Ambulatory Visit: Payer: Self-pay | Admitting: Physical Medicine and Rehabilitation

## 2016-05-09 ENCOUNTER — Ambulatory Visit
Admission: RE | Admit: 2016-05-09 | Discharge: 2016-05-09 | Disposition: A | Discharge status: Home / Self Care | Payer: No Typology Code available for payment source | Source: Ambulatory Visit | Attending: Physical Medicine and Rehabilitation | Admitting: Physical Medicine and Rehabilitation

## 2016-05-09 DIAGNOSIS — Z0000 Encounter for general adult medical examination without abnormal findings: Secondary | ICD-10-CM

## 2017-09-24 DIAGNOSIS — K219 Gastro-esophageal reflux disease without esophagitis: Secondary | ICD-10-CM | POA: Diagnosis not present

## 2017-09-24 DIAGNOSIS — K2970 Gastritis, unspecified, without bleeding: Secondary | ICD-10-CM | POA: Diagnosis not present

## 2017-09-24 DIAGNOSIS — R197 Diarrhea, unspecified: Secondary | ICD-10-CM | POA: Diagnosis not present

## 2017-09-25 ENCOUNTER — Ambulatory Visit (INDEPENDENT_AMBULATORY_CARE_PROVIDER_SITE_OTHER): Payer: 59 | Admitting: Physician Assistant

## 2017-09-25 ENCOUNTER — Other Ambulatory Visit: Payer: Self-pay

## 2017-09-25 ENCOUNTER — Ambulatory Visit (INDEPENDENT_AMBULATORY_CARE_PROVIDER_SITE_OTHER): Payer: 59

## 2017-09-25 ENCOUNTER — Encounter: Payer: Self-pay | Admitting: Physician Assistant

## 2017-09-25 VITALS — BP 122/84 | HR 99 | Temp 98.1°F | Resp 18 | Ht 72.0 in | Wt 228.4 lb

## 2017-09-25 DIAGNOSIS — K219 Gastro-esophageal reflux disease without esophagitis: Secondary | ICD-10-CM

## 2017-09-25 DIAGNOSIS — R1013 Epigastric pain: Secondary | ICD-10-CM | POA: Diagnosis not present

## 2017-09-25 DIAGNOSIS — R1030 Lower abdominal pain, unspecified: Secondary | ICD-10-CM | POA: Diagnosis not present

## 2017-09-25 MED ORDER — SUCRALFATE 1 GM/10ML PO SUSP: 1.0000 g | Freq: Three times a day (TID) | ORAL | 0 refills | Status: DC

## 2017-09-25 NOTE — Patient Instructions (Addendum)
Your symptoms are suggestive of GERD and possible underlying ulcer.  I recommend you continue with omeprazole daily.  You may also take Zantac daily at nighttime or as needed if you know you are going to eat  irritating food.  Zantac works more quickly than omeprazole but is not as long-term effective as omeprazole.  I have also given you a prescription for Carafate to use if you know you are going to have irritating food. You should take this 1 hour before meals that could be irritating.  If you do not realize you have had food that is irritating, you can take this after and should get some relief.  We have collected labs, breath test, and imaging today and will contact you with these results to discuss further treatment plan.  Otherwise, plan to follow-up in 4 weeks for reevaluation.  If any of your symptoms worsen or you develop new concerning symptoms, please seek care immediately.  Below is some information on food choices for GERD.   Food Choices for Gastroesophageal Reflux Disease, Adult When you have gastroesophageal reflux disease (GERD), the foods you eat and your eating habits are very important. Choosing the right foods can help ease your discomfort. What guidelines do I need to follow?  Choose fruits, vegetables, whole grains, and low-fat dairy products.  Choose low-fat meat, fish, and poultry.  Limit fats such as oils, salad dressings, butter, nuts, and avocado.  Keep a food diary. This helps you identify foods that cause symptoms.  Avoid foods that cause symptoms. These may be different for everyone.  Eat small meals often instead of 3 large meals a day.  Eat your meals slowly, in a place where you are relaxed.  Limit fried foods.  Cook foods using methods other than frying.  Avoid drinking alcohol.  Avoid drinking large amounts of liquids with your meals.  Avoid bending over or lying down until 2-3 hours after eating. What foods are not recommended? These are some  foods and drinks that may make your symptoms worse: Vegetables Tomatoes. Tomato juice. Tomato and spaghetti sauce. Chili peppers. Onion and garlic. Horseradish. Fruits Oranges, grapefruit, and lemon (fruit and juice). Meats High-fat meats, fish, and poultry. This includes hot dogs, ribs, ham, sausage, salami, and bacon. Dairy Whole milk and chocolate milk. Sour cream. Cream. Butter. Ice cream. Cream cheese. Drinks Coffee and tea. Bubbly (carbonated) drinks or energy drinks. Condiments Hot sauce. Barbecue sauce. Sweets/Desserts Chocolate and cocoa. Donuts. Peppermint and spearmint. Fats and Oils High-fat foods. This includes [place] fries and potato chips. Other Vinegar. Strong spices. This includes black pepper, white pepper, red pepper, cayenne, curry powder, cloves, ginger, and chili powder. The items listed above may not be a complete list of foods and drinks to avoid. Contact your dietitian for more information. This information is not intended to replace advice given to you by your health care provider. Make sure you discuss any questions you have with your health care provider. Document Released: 03/03/2012 Document Revised: 02/08/2016 Document Reviewed: 07/07/2013 Elsevier Interactive Patient Education  2017 [employer]    IF you received an x-ray today, you will receive an invoice from [facility]. Please contact [facility] at [phone] with questions or concerns regarding your invoice.   IF you received labwork today, you will receive an invoice from [facility]. Please contact [facility] at [phone] with questions or concerns regarding your invoice.   Our billing staff will not be able to assist you with questions regarding bills from these companies.  You will be contacted with the lab results as soon as they are available. The fastest way to get your results is to activate your My Chart account. Instructions are located on the last page of this  paperwork. If you have not heard from [place] regarding the results in 2 weeks, please contact this office.

## 2017-09-25 NOTE — Progress Notes (Signed)
Edwin Novak  MRN: [id number] DOB: 1991/04/04  Subjective:  Edwin Novak is a 27 y.o. male seen in office today for a chief complaint of establishing care and GERD.  Patient was seen at urgent care yesterday for worsening GERD symptoms and given prescriptions for Protonix and Zantac Was given GI cocktail in urgent care office with much relief.  He was told that this is that he needs to establish with a primary care for follow-up.  Of note, patient has never had a consistent PCP.  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  Notes that when he changed to night shift his diet went downhill.  He often eats eats aggravating foods such as hot sauce, cheese, pasta.  He typically eats very late into his shift and then goes immediately home and lies down.  Notes he has had consistent belching, acid reflux, and epigastric pain for the past 4 years.  He denies odynophagia, dysphagia, nausea, vomiting, hematemesis, nausea, unexplained weight loss, exertional chest pain, diaphoresis, and shortness of breath. Of note, patient has struggled with constipation in the past.  Has had left-sided abdominal pain for 10 years.  Last time he was evaluated for this it was due to moderate stool burden.  He never followed up in office.  He notes he typically has a bowel movement every day.  Notes that his job is pretty strenuous and therefore does not do structured activity outside of work.  He drinks about 12-20 beers per week.  Denies smoking.  Denies NSAID use.  Past surgical history of appendectomy.  No personal history of heart disease or hypertension.  No family history of gastric cancer or Barrett's esophagus that he is aware of.  Review of Systems  Constitutional: Negative for appetite change, chills and fever.  Respiratory: Negative for cough and wheezing.   Cardiovascular: Negative for palpitations and leg swelling.  Genitourinary: Negative for dysuria, frequency and  hematuria.  Neurological: Negative for dizziness, light-headedness and numbness.    Patient Active Problem List   Diagnosis Date Noted  . Generalized anxiety disorder 05/06/2014    Current Outpatient Medications on File Prior to Visit  Medication Sig Dispense Refill  . pantoprazole (PROTONIX) 40 MG tablet Take 40 mg by mouth daily.    . ranitidine (ZANTAC) 150 MG tablet Take 150 mg by mouth 2 (two) times daily.    . cyclobenzaprine (FLEXERIL) 5 MG tablet Take 1 tablet (5 mg total) by mouth at bedtime. (Patient not taking: Reported on 09/12/2015) 30 tablet 3  . OVER THE COUNTER MEDICATION Take 2 capsules by mouth daily.     No current facility-administered medications on file prior to visit.     No Known Allergies     Social History   Socioeconomic History  . Marital status: Single    Spouse name: Not on file  . Number of children: Not on file  . Years of education: Not on file  . Highest education level: Not on file  Social Needs  . Financial resource strain: Not on file  . Food insecurity - worry: Not on file  . Food insecurity - inability: Not on file  . Transportation needs - medical: Not on file  . Transportation needs - non-medical: Not on file  Occupational History  . Not on file  Tobacco Use  . Smoking status: Former Smoker    Last attempt to quit: 04/07/2014    Years since quitting: 3.4  .  Smokeless tobacco: Never Used  Substance and Sexual Activity  . Alcohol use: Yes    Comment: Drinks 1-2 weekends.   . Drug use: No  . Sexual activity: Not on file  Other Topics Concern  . Not on file  Social History Narrative  . Not on file   Past Surgical History:  Procedure Laterality Date  . APPENDECTOMY      Objective:  BP 122/84   Pulse 99   Temp 98.1 F (36.7 C) (Oral)   Resp 18   Ht 6' (1.829 m)   Wt 228 lb 6.4 oz (103.6 kg)   SpO2 98%   BMI 30.98 kg/m   Physical Exam  Constitutional: He is oriented to person, place, and time and well-developed,  well-nourished, and in no distress.  HENT:  Head: Normocephalic and atraumatic.  Eyes: Conjunctivae are normal.  Neck: Normal range of motion.  Cardiovascular: Normal rate, regular rhythm and normal heart sounds.  Pulmonary/Chest: Effort normal.  Abdominal: Soft. Bowel sounds are normal. He exhibits no shifting dullness, no distension and no mass. There is tenderness (mild in LUQ). There is no rigidity, no rebound, no guarding, no tenderness at McBurney's point and negative Murphy's sign.  Neurological: He is alert and oriented to person, place, and time. Gait normal.  Skin: Skin is warm and dry.  Psychiatric: Affect normal.  Vitals reviewed.  Dg Abd 1 View  Result Date: 09/25/2017 CLINICAL DATA:  Left abdominal pain EXAM: ABDOMEN - 1 VIEW COMPARISON:  05/05/2014 FINDINGS: The bowel gas pattern is normal. No radio-opaque calculi or other significant radiographic abnormality are seen. IMPRESSION: Negative. Electronically Signed   By: Rolm Baptise M.D.   On: 09/25/2017 11:38   Assessment and Plan :  1. Gastroesophageal reflux disease, esophagitis presence not specified 2. Abdominal pain, epigastric Pt with longstanding hx of GERD. No red flags noted in HPI. Labs pending. Pt is well appearing, no distress. Vitals stable. Given educational material on foods to avoid with GERD. Educated pt to avoid lying down at least 2 hours after he eats. He is motivated to change his lifestyle. Encouraged pt to continue with current medication regimen.Given Rx for carafate if he experiences another episode as he did yesterday which led him to the urgent care. Plan to follow up in office in 4 weeks, if still symptomatic at that time, refer to GI for further evaluation.  - CBC with Differential/Platelet - H. pylori breath test - CMP14+EGFR - Lipase - DG Abd 1 View; Future - sucralfate (CARAFATE) 1 GM/10ML suspension; Take 10 mLs (1 g total) by mouth 4 (four) times daily -  with meals and at bedtime.  Dispense:  420 mL; Refill: 0  Tenna Delaine PA-C  [facility] at [place] 09/25/2017 11:35 AM

## 2017-09-26 LAB — CBC WITH DIFFERENTIAL/PLATELET
BASOS ABS: 0 10*3/uL (ref 0.0–0.2)
Basos: 0 %
EOS (ABSOLUTE): 0.1 10*3/uL (ref 0.0–0.4)
Eos: 3 %
HEMOGLOBIN: 15.6 g/dL (ref 13.0–17.7)
Hematocrit: 44.2 % (ref 37.5–51.0)
IMMATURE GRANS (ABS): 0 10*3/uL (ref 0.0–0.1)
IMMATURE GRANULOCYTES: 0 %
LYMPHS: 25 %
Lymphocytes Absolute: 1.1 10*3/uL (ref 0.7–3.1)
MCH: 29.3 pg (ref 26.6–33.0)
MCHC: 35.3 g/dL (ref 31.5–35.7)
MCV: 83 fL (ref 79–97)
Monocytes Absolute: 0.6 10*3/uL (ref 0.1–0.9)
Monocytes: 15 %
NEUTROS PCT: 57 %
Neutrophils Absolute: 2.5 10*3/uL (ref 1.4–7.0)
PLATELETS: 173 10*3/uL (ref 150–379)
RBC: 5.33 x10E6/uL (ref 4.14–5.80)
RDW: 14.2 % (ref 12.3–15.4)
WBC: 4.3 10*3/uL (ref 3.4–10.8)

## 2017-09-26 LAB — CMP14+EGFR
ALT: 21 IU/L (ref 0–44)
AST: 22 IU/L (ref 0–40)
Albumin/Globulin Ratio: 1.6 (ref 1.2–2.2)
Albumin: 4.7 g/dL (ref 3.5–5.5)
Alkaline Phosphatase: 52 IU/L (ref 39–117)
BUN/Creatinine Ratio: 10 (ref 9–20)
BUN: 10 mg/dL (ref 6–20)
Bilirubin Total: 0.5 mg/dL (ref 0.0–1.2)
CALCIUM: 9.3 mg/dL (ref 8.7–10.2)
CO2: 23 mmol/L (ref 20–29)
Chloride: 99 mmol/L (ref 96–106)
Creatinine, Ser: 0.99 mg/dL (ref 0.76–1.27)
GFR, EST AFRICAN AMERICAN: 119 mL/min/{1.73_m2} (ref >59)
GFR, EST NON AFRICAN AMERICAN: 103 mL/min/{1.73_m2} (ref >59)
Globulin, Total: 2.9 g/dL (ref 1.5–4.5)
Glucose: 93 mg/dL (ref 65–99)
Potassium: 3.9 mmol/L (ref 3.5–5.2)
Sodium: 139 mmol/L (ref 134–144)
TOTAL PROTEIN: 7.6 g/dL (ref 6.0–8.5)

## 2017-09-26 LAB — H. PYLORI BREATH TEST: H PYLORI BREATH TEST: NEGATIVE

## 2017-09-26 LAB — LIPASE: Lipase: 25 U/L (ref 13–78)

## 2017-12-15 ENCOUNTER — Encounter: Payer: Self-pay | Admitting: Physician Assistant

## 2017-12-15 ENCOUNTER — Ambulatory Visit: Payer: 59 | Admitting: Physician Assistant

## 2017-12-15 ENCOUNTER — Other Ambulatory Visit: Payer: Self-pay

## 2017-12-15 VITALS — BP 136/78 | HR 93 | Temp 98.7°F | Resp 20 | Ht 72.72 in | Wt 233.4 lb

## 2017-12-15 DIAGNOSIS — F411 Generalized anxiety disorder: Secondary | ICD-10-CM

## 2017-12-15 DIAGNOSIS — F410 Panic disorder [episodic paroxysmal anxiety] without agoraphobia: Secondary | ICD-10-CM

## 2017-12-15 MED ORDER — ESCITALOPRAM OXALATE 10 MG PO TABS: 10.0000 mg | ORAL_TABLET | Freq: Every day | ORAL | 0 refills | Status: DC

## 2017-12-15 MED ORDER — CLONAZEPAM 0.25 MG PO TBDP: 0.2500 mg | ORAL_TABLET | Freq: Two times a day (BID) | ORAL | 0 refills | Status: DC | PRN

## 2017-12-15 NOTE — Progress Notes (Signed)
Edwin RosebushJeremy Novak  MRN: [id number] DOB: [birth year]  Subjective:  Edwin RosebushJeremy Novak is a 27 y.o. male seen in office today for a chief complaint of anxiety.  Patient has dealt with anxiety for quite some time.  He was last on medication 1 year ago.  Stopped taking it because he felt like he was doing better.  He has tried Zoloft  and lorazepam in the past with success.  Patient notes that for the past few months he has had excessive worrying about everything. He is having trouble concentrating.  He has had a lot of stuff happened as a child and lots of issues with his parents that he does not like to talk about.  Notes he has been having frequent panic attacks over the past few months.  These moments he will get very warm, his heart will race, feels short of breath, and have racing thoughts.  They are happening about 3 times a week. These used to happen a lot before taking medication in the past.  Notes he is at a point where he feels like he cannot function throughout the day due to his anxiety.  Has some associated dysphoric mood and decreased interest in doing things he wants like to do.  He denies suicidal homicidal thoughts/ideations.  Denies hallucinations, grandiose thoughts, decreased need for sleep, excessive talking, risky behaviors, and enhanced mood.   Notes he just wants to feel normal again.  He used to go to counseling in the past but really dislikes talking with others about his problems.  Will occasionally drink alcohol to feel better.  Denies illicit drug use. No PMH of seizure disorder.   Review of Systems  Constitutional: Negative for chills, diaphoresis and fever.  Cardiovascular: Negative for chest pain.  Neurological: Negative for dizziness and seizures.    Patient Active Problem List   Diagnosis Date Noted  . Generalized anxiety disorder 05/06/2014    Current Outpatient Medications on File Prior to Visit  Medication Sig Dispense Refill  . pantoprazole (PROTONIX) 40 MG tablet  Take 40 mg by mouth daily.    . cyclobenzaprine (FLEXERIL) 5 MG tablet Take 1 tablet (5 mg total) by mouth at bedtime. (Patient not taking: Reported on 09/12/2015) 30 tablet 3  . OVER THE COUNTER MEDICATION Take 2 capsules by mouth daily.    . ranitidine (ZANTAC) 150 MG tablet Take 150 mg by mouth 2 (two) times daily.    . sucralfate (CARAFATE) 1 GM/10ML suspension Take 10 mLs (1 g total) by mouth 4 (four) times daily -  with meals and at bedtime. (Patient not taking: Reported on 12/15/2017) 420 mL 0   No current facility-administered medications on file prior to visit.     No Known Allergies   Objective:  BP 136/78 (BP Location: Left Arm, Patient Position: Sitting, Cuff Size: Large)   Pulse 93   Temp 98.7 F (37.1 C) (Oral)   Resp 20   Ht 6' 0.72" (1.847 m)   Wt 233 lb 6.4 oz (105.9 kg)   SpO2 100%   BMI 31.03 kg/m   Physical Exam  Constitutional: He is oriented to person, place, and time and well-developed, well-nourished, and in no distress.  HENT:  Head: Normocephalic and atraumatic.  Eyes: Conjunctivae are normal.  Neck: Normal range of motion.  Pulmonary/Chest: Effort normal.  Neurological: He is alert and oriented to person, place, and time. Gait normal.  Skin: Skin is warm and dry.  Psychiatric: Affect normal.  Vitals reviewed.  GAD 7 : Generalized Anxiety Score 12/15/2017  Nervous, Anxious, on Edge 3  Control/stop worrying 3  Worry too much - different things 3  Trouble relaxing 3  Restless 0  Easily annoyed or irritable 3  Afraid - awful might happen 0  Total GAD 7 Score 15  Anxiety Difficulty Somewhat difficult    Depression screen PHQ 2/9 12/15/2017  Decreased Interest 2  Down, Depressed, Hopeless 2  PHQ - 2 Score 4  Altered sleeping 2  Tired, decreased energy 2  Change in appetite 1  Feeling bad or failure about yourself  2  Trouble concentrating 3  Moving slowly or fidgety/restless 1  Suicidal thoughts 0  PHQ-9 Score 15  Difficult doing  work/chores Somewhat difficult     Assessment and Plan :  1. Generalized anxiety disorder  Patient's anxiety is uncontrolled at this time. Has some associated depression but it seems that anxiety is driving this. He would benefit from long-term medication and short acting medication for moments of panic attacks. I also think he would benefit from counseling. Given contact info for local therapists. Given Rx for lexapro and klonopin. Educated pt on potential side effects of each. Educated pt that he should avoid use of alcohol while on these medications and he voices his understanding.  Explained that klonopin use should be only for moments of true panic attack as it does have addictive properties and is not intended for daily use. Plan to follow up in 4 weeks for reevaluation.[place] controlled substance database was reviewed and is consistent with patient's hx. He completed the controlled substance agreement and agrees to only get refills for klonopin from me.  - escitalopram (LEXAPRO) 10 MG tablet; Take 1 tablet (10 mg total) by mouth daily.  Dispense: 90 tablet; Refill: 0  2. Panic attacks - clonazePAM (KLONOPIN) 0.25 MG disintegrating tablet; Take 1 tablet (0.25 mg total) by mouth 2 (two) times daily as needed (panic attack).  Dispense: 30 tablet; Refill: 0   A total of 25 minutes was spent in the room with the patient, greater than 50% of which was in counseling/coordination of care regarding anxiety, depression, and panic attacks.  Benjiman Core PA-C  Primary Care at [facility] 12/15/2017 8:27 AM

## 2017-12-15 NOTE — Patient Instructions (Addendum)
For depression and anxiety, I recommend we start a daily SSRI and weekly/monthly therapy. I also recommend a short term medication for moments of panic attacks. The SSRI I am prescribing is called lexapro. Please keep in mind that when you start an SSRI it can worsen your depression and anxiety symptoms. It can also increase risk of suicidal thoughts so if this occurs, please seek care immediately. Common side effects of SSRIs include, but are not limited to, GI upset, nausea, vomiting, insomnia, and drowsiness. Typically these side effects will resolve after 2 weeks. Please keep in mind that it can take up to 4-6 weeks for SSRIs to be fully effective. In terms of relief of panic attacks, please use clonazapem as needed.  This medication can make you drowsy so please keep this in mind. It can also be habit forming, so please use cautiously. You cannot drink alcohol with this medication. Below is some information for local therapists. I recommend you reach out to one of these therapists before our next visit.Plan to return to clinic in 4 weeks for reevaluation of symptoms.   For therapy -- [facility] ([address]Beth Coralie CommonKincaid, Ernest McCoy, Heather Joycelyn SchmidKitchens, Karla [place]) - [phone] [facility](Julie Fremont[place]itt) - [phone] [facility] - [phone] [facility] - [phone] Irish[address] Mylan - [phone] [facility]  - [phone] (do not file insurance)     IF you received an x-ray today, you will receive an invoice from [facility]. Please contact [facility] at [phone] with questions or concerns regarding your invoice.   IF you received labwork today, you will receive an invoice from [facility]. Please contact [facility] at [phone] with questions or concerns regarding your invoice.   Our billing staff will not be able to assist you with questions regarding bills from these  companies.  You will be contacted with the lab results as soon as they are available. The fastest way to get your results is to activate your My Chart account. Instructions are located on the last page of this paperwork. If you have not heard from us regarding the results in 2 weeks, please contact this office.

## 2018-01-14 ENCOUNTER — Ambulatory Visit: Payer: 59 | Admitting: Physician Assistant

## 2018-01-17 ENCOUNTER — Ambulatory Visit: Payer: 59 | Admitting: Physician Assistant

## 2018-01-19 ENCOUNTER — Ambulatory Visit: Payer: 59 | Admitting: Physician Assistant

## 2018-07-22 DIAGNOSIS — Z0000 Encounter for general adult medical examination without abnormal findings: Secondary | ICD-10-CM | POA: Diagnosis not present

## 2018-07-22 DIAGNOSIS — E663 Overweight: Secondary | ICD-10-CM | POA: Diagnosis not present

## 2018-08-18 DIAGNOSIS — E663 Overweight: Secondary | ICD-10-CM | POA: Diagnosis not present

## 2018-11-18 DIAGNOSIS — E663 Overweight: Secondary | ICD-10-CM | POA: Diagnosis not present

## 2018-11-18 DIAGNOSIS — Z6826 Body mass index (BMI) 26.0-26.9, adult: Secondary | ICD-10-CM | POA: Diagnosis not present

## 2018-12-04 IMAGING — DX DG ABDOMEN 1V
2 series · 2 of 2 positions shown · non-contrast
Comparison: 05/05/2014

CLINICAL DATA: Left abdominal pain

EXAM:
ABDOMEN - 1 VIEW

[abdomen kub (1 of 2)]
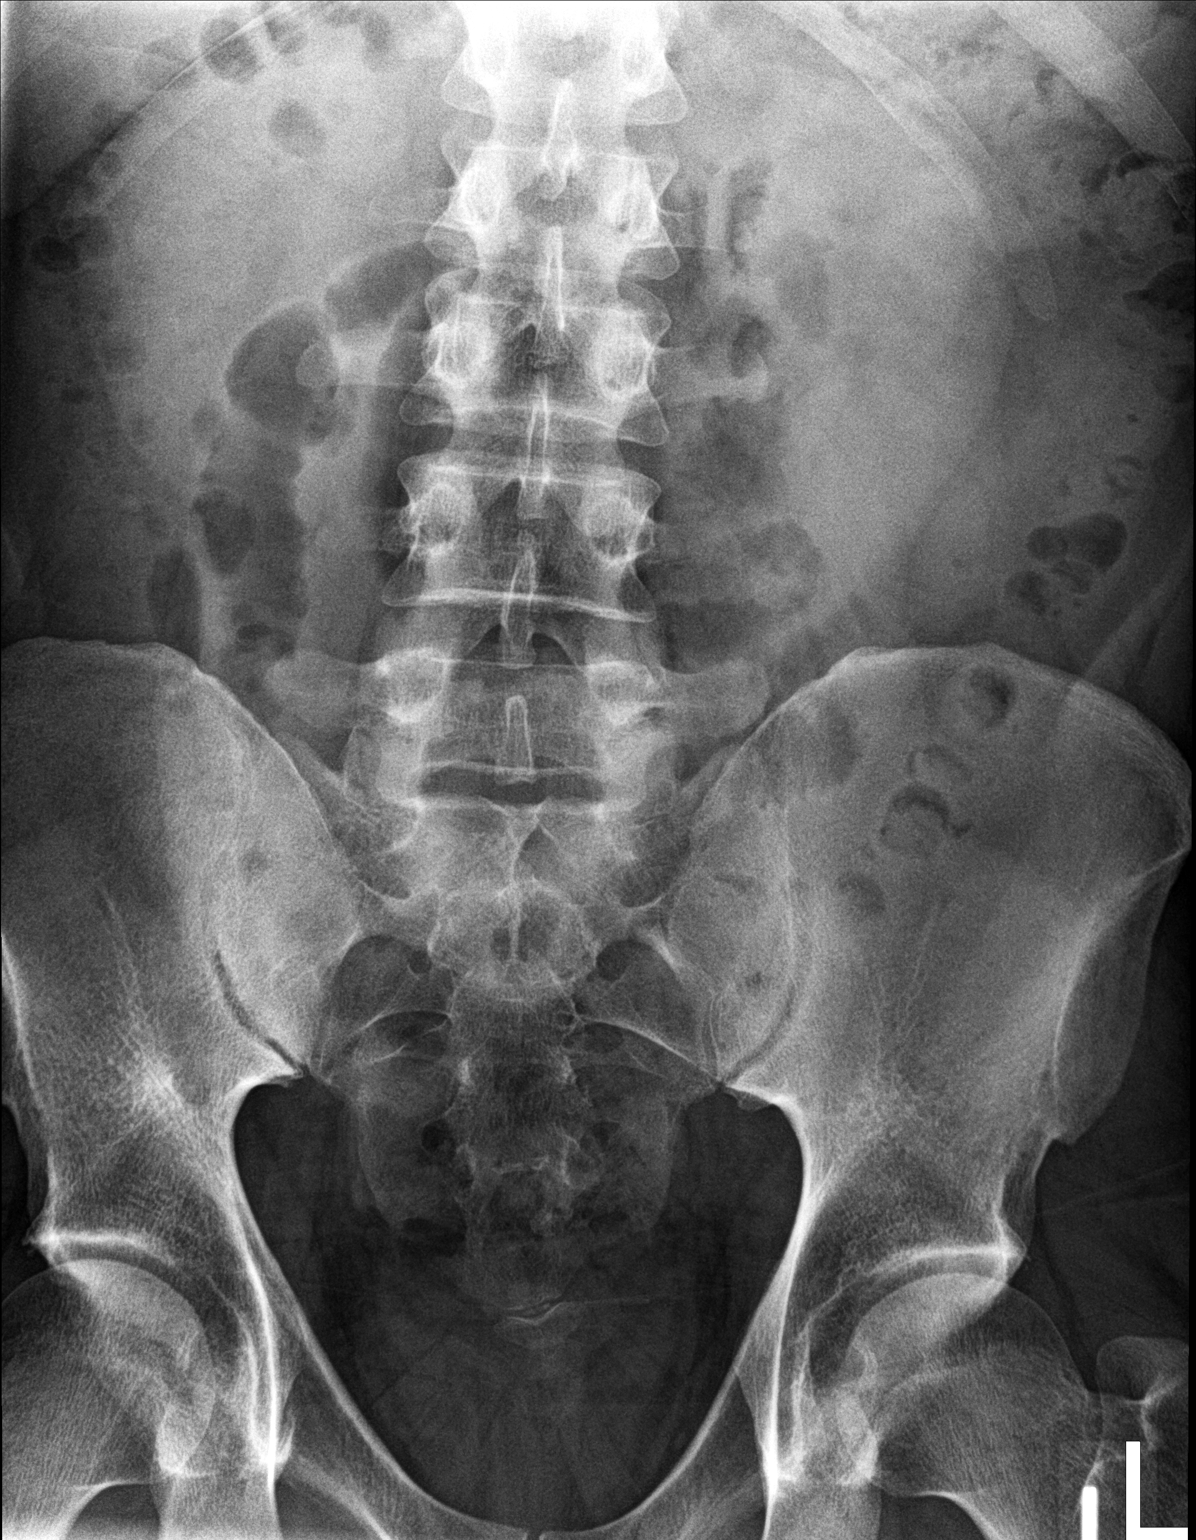

[abdomen kub (2 of 2)]
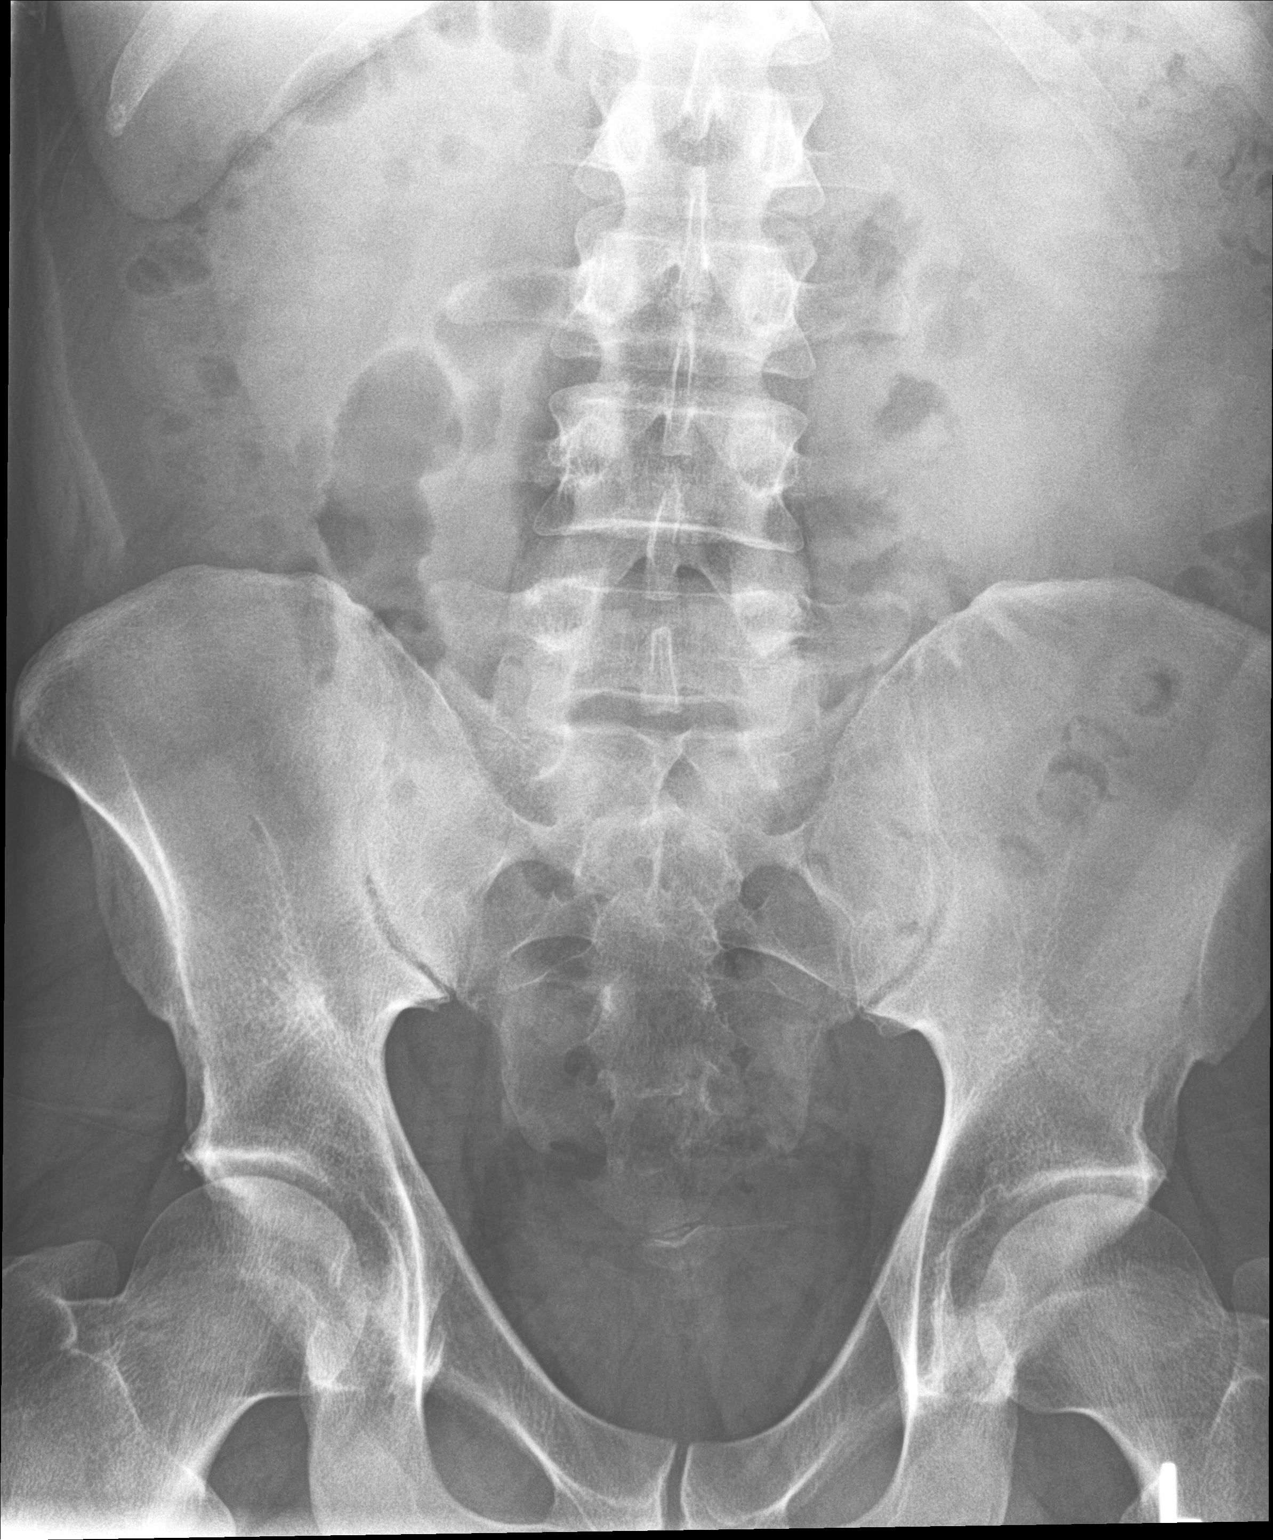

[2 of 2 positions shown; findings below may reference images not displayed]

FINDINGS: The bowel gas pattern is normal. No radio-opaque calculi or other
significant radiographic abnormality are seen.
IMPRESSION: Negative.

## 2022-07-24 ENCOUNTER — Encounter: Payer: Self-pay | Admitting: Urology

## 2022-07-24 ENCOUNTER — Ambulatory Visit: Payer: 59 | Admitting: Urology

## 2022-07-24 VITALS — BP 140/86 | HR 88 | Ht 72.0 in | Wt 250.0 lb

## 2022-07-24 DIAGNOSIS — E291 Testicular hypofunction: Secondary | ICD-10-CM

## 2022-07-24 NOTE — Progress Notes (Signed)
Assessment: 1. Hypogonadism in male     Plan: I reviewed the patient's records from [facility] including provider notes and lab results. I had a lengthy discussion with the patient regarding diagnosis of hypogonadism and management options.  At this point, I recommended that he discontinue the testosterone injections to allow further assessment of possible causes of his hypogonadism.  I also discussed the risk of infertility associated with testosterone replacement therapy.  Due to his interest in maintaining his fertility, I would not recommend testosterone replacement therapy for him. Discontinue testosterone injections. A.m. labs in 2-3 weeks: Free and total testosterone, prolactin, LH, albumin, sex hormone binding globulin, and CBC We will call him with results and recommendations.  I discussed use of clomiphene citrate for management of men with low testosterone who desire to maintain their fertility.  Chief Complaint:  Chief Complaint  Patient presents with   Hypogonadism    History of Present Illness:  Edwin Novak is a 31 y.o. male who is seen in consultation from Assunta Found, MD for evaluation of low testosterone.  He has had symptoms of decreased libido, erectile dysfunction, decreased energy, emotional lability, decreased physical performance and generalized malaise for several months.  He did a home test which showed a testosterone level of 300.  He was seen by his PCP at [facility]. Testosterone level from 05/05/2022: 265 with free testosterone of 9.1. He was started on testosterone injections 200 mg every month.  He noted some improvement shortly after the injection.  Most recently, he has been on 100 mg every 10 days.  His last injection was on 07/18/2022.  He is interested in maintaining his fertility.   Past Medical History:  Past Medical History:  Diagnosis Date   Anxiety    Depression     Past Surgical History:  Past Surgical History:   Procedure Laterality Date   APPENDECTOMY      Allergies:  No Known Allergies  Family History:  Family History  Problem Relation Age of Onset   Cancer Mother        ovarian   Mental illness Mother        anxiety   Hyperlipidemia Father    Hypertension Father    Cancer Father 57       gallbladder cancer   Mental illness Brother        anxiety    Social History:  Social History   Tobacco Use   Smoking status: Former    Types: Cigarettes    Quit date: 04/07/2014    Years since quitting: 8.3   Smokeless tobacco: Never  Vaping Use   Vaping Use: Never used  Substance Use Topics   Alcohol use: Yes    Comment: Drinks 1-2 weekends.    Drug use: No    Review of symptoms:  Constitutional:  Negative for unexplained weight loss, night sweats, fever, chills ENT:  Negative for nose bleeds, sinus pain, painful swallowing CV:  Negative for chest pain, shortness of breath, exercise intolerance, palpitations, loss of consciousness Resp:  Negative for cough, wheezing, shortness of breath GI:  Negative for nausea, vomiting, diarrhea, bloody stools GU:  Positives noted in HPI; otherwise negative for gross hematuria, dysuria, urinary incontinence Neuro:  Negative for seizures, poor balance, limb weakness, slurred speech Psych:  Negative for lack of energy, depression, anxiety Endocrine:  Negative for polydipsia, polyuria, symptoms of hypoglycemia (dizziness, hunger, sweating) Hematologic:  Negative for anemia, purpura, petechia, prolonged or excessive bleeding, use of anticoagulants  Allergic:  Negative for difficulty breathing or choking as a result of exposure to anything; no shellfish allergy; no allergic response (rash/itch) to materials, foods  Physical exam: BP (!) 140/86   Pulse 88   Ht 6' (1.829 m)   Wt 250 lb (113.4 kg)   BMI 33.91 kg/m  GENERAL APPEARANCE:  Well appearing, well developed, well nourished, NAD HEENT: Atraumatic, Normocephalic, oropharynx clear. NECK:  Supple without lymphadenopathy or thyromegaly. LUNGS: Clear to auscultation bilaterally. HEART: Regular Rate and Rhythm without murmurs, gallops, or rubs. ABDOMEN: Soft, non-tender, No Masses. EXTREMITIES: Moves all extremities well.  Without clubbing, cyanosis, or edema. NEUROLOGIC:  Alert and oriented x 3, normal gait, CN II-XII grossly intact.  MENTAL STATUS:  Appropriate. BACK:  Non-tender to palpation.  No CVAT SKIN:  Warm, dry and intact.   GU: Penis:  circumcised Meatus: Normal Scrotum: normal, no masses Testis: normal without masses bilateral Epididymis: normal   Results: None

## 2022-07-24 NOTE — Patient Instructions (Signed)
Clomiphene citrate (Clomid)

## 2022-07-25 LAB — URINALYSIS, ROUTINE W REFLEX MICROSCOPIC
Bilirubin, UA: NEGATIVE
Glucose, UA: NEGATIVE
Leukocytes,UA: NEGATIVE
Nitrite, UA: NEGATIVE
Protein,UA: NEGATIVE
RBC, UA: NEGATIVE
Specific Gravity, UA: 1.02 (ref 1.005–1.030)
Urobilinogen, Ur: 0.2 mg/dL (ref 0.2–1.0)
pH, UA: 6 (ref 5.0–7.5)

## 2023-05-02 ENCOUNTER — Other Ambulatory Visit: Payer: Self-pay | Admitting: Nurse Practitioner

## 2023-05-02 ENCOUNTER — Ambulatory Visit: Payer: Self-pay

## 2023-05-02 DIAGNOSIS — Z0000 Encounter for general adult medical examination without abnormal findings: Secondary | ICD-10-CM
